# Patient Record
Sex: Female | Born: 1965 | Race: White | Hispanic: No
Health system: Southern US, Community
[De-identification: ages and names within clinical notes are randomized; demographics above are authoritative.]

---

## 2020-10-02 ENCOUNTER — Ambulatory Visit: Payer: 59 | Admitting: Family Medicine

## 2020-10-02 ENCOUNTER — Ambulatory Visit (HOSPITAL_BASED_OUTPATIENT_CLINIC_OR_DEPARTMENT_OTHER)
Admission: RE | Admit: 2020-10-02 | Discharge: 2020-10-02 | Disposition: A | Discharge status: Home / Self Care | Payer: 59 | Source: Ambulatory Visit | Attending: Family Medicine | Admitting: Family Medicine

## 2020-10-02 ENCOUNTER — Other Ambulatory Visit: Payer: Self-pay

## 2020-10-02 ENCOUNTER — Ambulatory Visit: Payer: Self-pay

## 2020-10-02 VITALS — BP 120/80 | Ht 61.0 in | Wt 145.0 lb

## 2020-10-02 DIAGNOSIS — M25532 Pain in left wrist: Secondary | ICD-10-CM

## 2020-10-02 DIAGNOSIS — M19049 Primary osteoarthritis, unspecified hand: Secondary | ICD-10-CM

## 2020-10-02 NOTE — Progress Notes (Signed)
  Erika Jenkins - 55 y.o. female MRN [id number]  Date of birth: 05/13/1966  SUBJECTIVE:  Including CC & ROS.  No chief complaint on file.   Erika Jenkins is a 55 y.o. female that is presenting with acute left thumb and wrist pain.  This is occurring over the [facility] joint.  She is active in martial arts and hits on a regular basis.  The pain has become more regular severe.  It is surrounding the base of the thumb.  She is having swelling and worsening of the pain.  Has tried wrapping her wrist.  No history of surgery.   Review of Systems See HPI   HISTORY: Past Medical, Surgical, Social, and Family History Reviewed & Updated per EMR.   Pertinent Historical Findings include:  History reviewed. No pertinent past medical history.  History reviewed. No pertinent surgical history.  History reviewed. No pertinent family history.  Social History   Socioeconomic History  . Marital status: Unknown    Spouse name: Not on file  . Number of children: Not on file  . Years of education: Not on file  . Highest education level: Not on file  Occupational History  . Not on file  Tobacco Use  . Smoking status: Not on file  . Smokeless tobacco: Not on file  Substance and Sexual Activity  . Alcohol use: Not on file  . Drug use: Not on file  . Sexual activity: Not on file  Other Topics Concern  . Not on file  Social History Narrative  . Not on file   Social Determinants of Health   Financial Resource Strain: Not on file  Food Insecurity: Not on file  Transportation Needs: Not on file  Physical Activity: Not on file  Stress: Not on file  Social Connections: Not on file  Intimate Partner Violence: Not on file     PHYSICAL EXAM:  VS: BP 120/80 (BP Location: Right Arm, Patient Position: Sitting, Cuff Size: Large)   Ht 5\' 1"  (1.549 m)   Wt 145 lb (65.8 kg)   BMI 27.40 kg/m  Physical Exam Gen: NAD, alert, cooperative with exam, well-appearing MSK:  Left hand: Swelling and  ecchymosis occurring overlying the CMC joint. Tenderness to palpation of the [facility] joint. No instability with the base of the thumb. Tenderness to palpation over the scaphoid in the snuffbox. Limited range of motion of the wrist. Neurovascular intact  Limited ultrasound: Left hand:  Degenerative changes appreciated of the [facility] joint. Degenerative changes appreciated of the trapezoid and trapezium. Has increased hyperemia surrounding the distal pole of the scaphoid.  Concern for possible fracture. No change of the distal radius.  Normal-appearing first dorsal compartment.  Summary: Degenerative changes appreciated within the wrist with concern for possible occult scaphoid fracture.  Ultrasound and interpretation by [facility], MD    ASSESSMENT & PLAN:   CMC arthritis She does have degenerative changes of the [facility] joint and other carpal joints.  Concern for possible scaphoid fracture. -Counseled on home exercise therapy and supportive care. -Thumb spica. -X-ray. -Follow-up in 2 weeks.  Could consider injection versus further imaging.

## 2020-10-02 NOTE — Patient Instructions (Signed)
Nice to meet you Please try ice on the area  Please try the splint  I will call with the results from today   Please send me a message in MyChart with any questions or updates.  Please see me back in 2 weeks.   --Dr. Jordan Likes

## 2020-10-03 ENCOUNTER — Encounter: Payer: Self-pay | Admitting: Family Medicine

## 2020-10-03 DIAGNOSIS — M19049 Primary osteoarthritis, unspecified hand: Secondary | ICD-10-CM | POA: Insufficient documentation

## 2020-10-03 NOTE — Assessment & Plan Note (Signed)
She does have degenerative changes of the [facility] joint and other carpal joints.  Concern for possible scaphoid fracture. -Counseled on home exercise therapy and supportive care. -Thumb spica. -X-ray. -Follow-up in 2 weeks.  Could consider injection versus further imaging.

## 2020-10-09 ENCOUNTER — Telehealth: Payer: Self-pay | Admitting: Family Medicine

## 2020-10-09 NOTE — Telephone Encounter (Signed)
Pt informed of below.  She will keep ROV on 10/16/20.

## 2020-10-09 NOTE — Telephone Encounter (Signed)
Left VM for patient. If she calls back please have her speak with a nurse/CMA and inform that her xray is showing severe degenerative changes at the joint at the base of the thumb.   If any questions then please take the best time and phone number to call and I will try to call her back.   Myra Rude, MD [facility] 10/09/2020, 8:15 AM

## 2020-10-16 ENCOUNTER — Ambulatory Visit: Payer: Self-pay

## 2020-10-16 ENCOUNTER — Other Ambulatory Visit: Payer: Self-pay

## 2020-10-16 ENCOUNTER — Ambulatory Visit (INDEPENDENT_AMBULATORY_CARE_PROVIDER_SITE_OTHER): Payer: 59 | Admitting: Family Medicine

## 2020-10-16 VITALS — BP 120/62 | Ht 61.0 in | Wt 140.0 lb

## 2020-10-16 DIAGNOSIS — M19049 Primary osteoarthritis, unspecified hand: Secondary | ICD-10-CM

## 2020-10-16 MED ORDER — TRIAMCINOLONE ACETONIDE 40 MG/ML IJ SUSP
40.0000 mg | Freq: Once | INTRAMUSCULAR | Status: AC
  Administered 2020-10-16: 40 mg via INTRA_ARTICULAR

## 2020-10-16 NOTE — Progress Notes (Signed)
  Erika Jenkins - 55 y.o. female MRN [id number]  Date of birth: [birth year]  SUBJECTIVE:  Including CC & ROS.  No chief complaint on file.   Erika Jenkins is a 55 y.o. female that is presenting with worsening of his left thumb pain.  Has been using the thumb spica.  Pain is intermittent in nature.  Still having some swelling.  Independent review of the left wrist x-ray from 2/16 shows severe degenerative change of the [facility] joint.   Review of Systems See HPI   HISTORY: Past Medical, Surgical, Social, and Family History Reviewed & Updated per EMR.   Pertinent Historical Findings include:  No past medical history on file.  No past surgical history on file.  No family history on file.  Social History   Socioeconomic History  . Marital status: Unknown    Spouse name: Not on file  . Number of children: Not on file  . Years of education: Not on file  . Highest education level: Not on file  Occupational History  . Not on file  Tobacco Use  . Smoking status: Not on file  . Smokeless tobacco: Not on file  Substance and Sexual Activity  . Alcohol use: Not on file  . Drug use: Not on file  . Sexual activity: Not on file  Other Topics Concern  . Not on file  Social History Narrative  . Not on file   Social Determinants of Health   Financial Resource Strain: Not on file  Food Insecurity: Not on file  Transportation Needs: Not on file  Physical Activity: Not on file  Stress: Not on file  Social Connections: Not on file  Intimate Partner Violence: Not on file     PHYSICAL EXAM:  VS: BP 120/62 (BP Location: Right Arm, Patient Position: Sitting, Cuff Size: Normal)   Ht 5\' 1"  (1.549 m)   Wt 140 lb (63.5 kg)   BMI 26.45 kg/m  Physical Exam Gen: NAD, alert, cooperative with exam, well-appearing MSK:  Left wrist: Swelling and tenderness palpation of the CMC joint. Limited range of motion. Neurovascular intact   Aspiration/Injection Procedure Note Erika Jenkins 1966-02-26  Procedure: Injection Indications: Left thumb pain  Procedure Details Consent: Risks of procedure as well as the alternatives and risks of each were explained to the (patient/caregiver).  Consent for procedure obtained. Time Out: Verified patient identification, verified procedure, site/side was marked, verified correct patient position, special equipment/implants available, medications/allergies/relevent history reviewed, required imaging and test results available.  Performed.  The area was cleaned with iodine and alcohol swabs.    The left CMC joint was injected using 1 cc's of 40 mg Kenalog and 1 cc's of 0.25% bupivacaine with a 25 1 1/2" needle.  Ultrasound was used. Images were obtained in long views showing the injection.     A sterile dressing was applied.  Patient did tolerate procedure well.     ASSESSMENT & PLAN:   CMC arthritis Pain is ongoing. Still wants to be active.  -Counseled on home exercise therapy and supportive care. -Injection. -Referral occupational therapy. -Counseled on using the thumb spica

## 2020-10-16 NOTE — Addendum Note (Signed)
Addended by: Merrilyn Puma on: 10/16/2020 11:12 AM   Modules accepted: Orders

## 2020-10-16 NOTE — Assessment & Plan Note (Signed)
Pain is ongoing. Still wants to be active.  -Counseled on home exercise therapy and supportive care. -Injection. -Referral occupational therapy. -Counseled on using the thumb spica

## 2020-10-16 NOTE — Patient Instructions (Signed)
Good to see you Please try ice  We'll see you to the therapist   Please send me a message in MyChart with any questions or updates.  Please see me back in 4-6 weeks.   --Dr. Jordan Likes

## 2020-11-20 ENCOUNTER — Ambulatory Visit: Payer: 59 | Admitting: Family Medicine

## 2020-11-20 ENCOUNTER — Other Ambulatory Visit: Payer: Self-pay

## 2020-11-20 DIAGNOSIS — M19049 Primary osteoarthritis, unspecified hand: Secondary | ICD-10-CM | POA: Diagnosis not present

## 2020-11-20 NOTE — Patient Instructions (Signed)
Good to see you Please continue taping  Please use ice as needed   Please send me a message in MyChart with any questions or updates.  Please see [place] back as needed.   --Dr. Jordan Likes

## 2020-11-20 NOTE — Assessment & Plan Note (Signed)
Has had improvement since the injection. -Counseled on home exercise therapy and supportive care. -Could consider therapy -Follow-up as needed.

## 2020-11-20 NOTE — Progress Notes (Signed)
  Erika Jenkins - 55 y.o. female MRN [id number]  Date of birth: 23-Oct-1965  SUBJECTIVE:  Including CC & ROS.  No chief complaint on file.   Erika Jenkins is a 55 y.o. female that is following up for her left thumb pain.  She has been doing well since the injection.  She has been taping her thumb as well as using the thumb spica.    Review of Systems See HPI   HISTORY: Past Medical, Surgical, Social, and Family History Reviewed & Updated per EMR.   Pertinent Historical Findings include:  No past medical history on file.  No past surgical history on file.  No family history on file.  Social History   Socioeconomic History  . Marital status: Unknown    Spouse name: Not on file  . Number of children: Not on file  . Years of education: Not on file  . Highest education level: Not on file  Occupational History  . Not on file  Tobacco Use  . Smoking status: Not on file  . Smokeless tobacco: Not on file  Substance and Sexual Activity  . Alcohol use: Not on file  . Drug use: Not on file  . Sexual activity: Not on file  Other Topics Concern  . Not on file  Social History Narrative  . Not on file   Social Determinants of Health   Financial Resource Strain: Not on file  Food Insecurity: Not on file  Transportation Needs: Not on file  Physical Activity: Not on file  Stress: Not on file  Social Connections: Not on file  Intimate Partner Violence: Not on file     PHYSICAL EXAM:  VS: BP 100/70 (BP Location: Right Arm, Patient Position: Sitting, Cuff Size: Normal)   Ht 5\' 1"  (1.549 m)   Wt 140 lb (63.5 kg)   BMI 26.45 kg/m  Physical Exam Gen: NAD, alert, cooperative with exam, well-appearing MSK:  Left thumb: No swelling or ecchymosis. Good range of motion. No tenderness to palpation. Neurovascular intact     ASSESSMENT & PLAN:   CMC arthritis Has had improvement since the injection. -Counseled on home exercise therapy and supportive care. -Could  consider therapy -Follow-up as needed.

## 2021-10-13 IMAGING — DX DG WRIST COMPLETE 3+V*L*
4 series · 4 of 4 positions shown · non-contrast
Comparison: None.

CLINICAL DATA: Left wrist pain

EXAM:
LEFT WRIST - COMPLETE 3+ VIEW

[wrist pa]
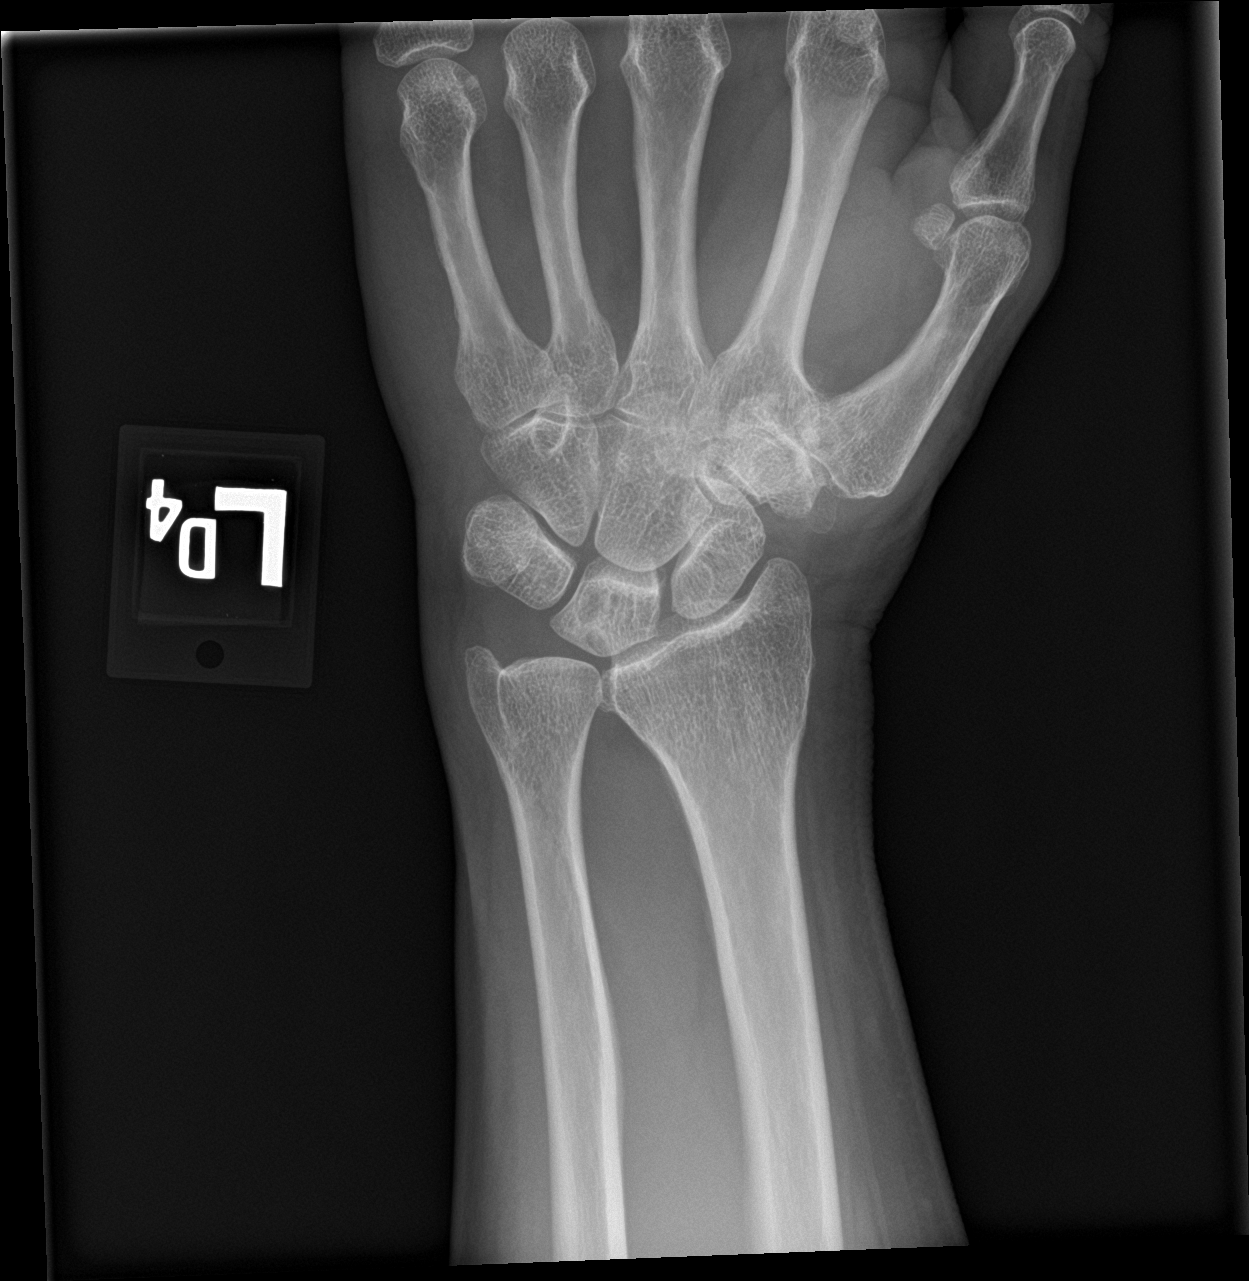

[wrist obl]
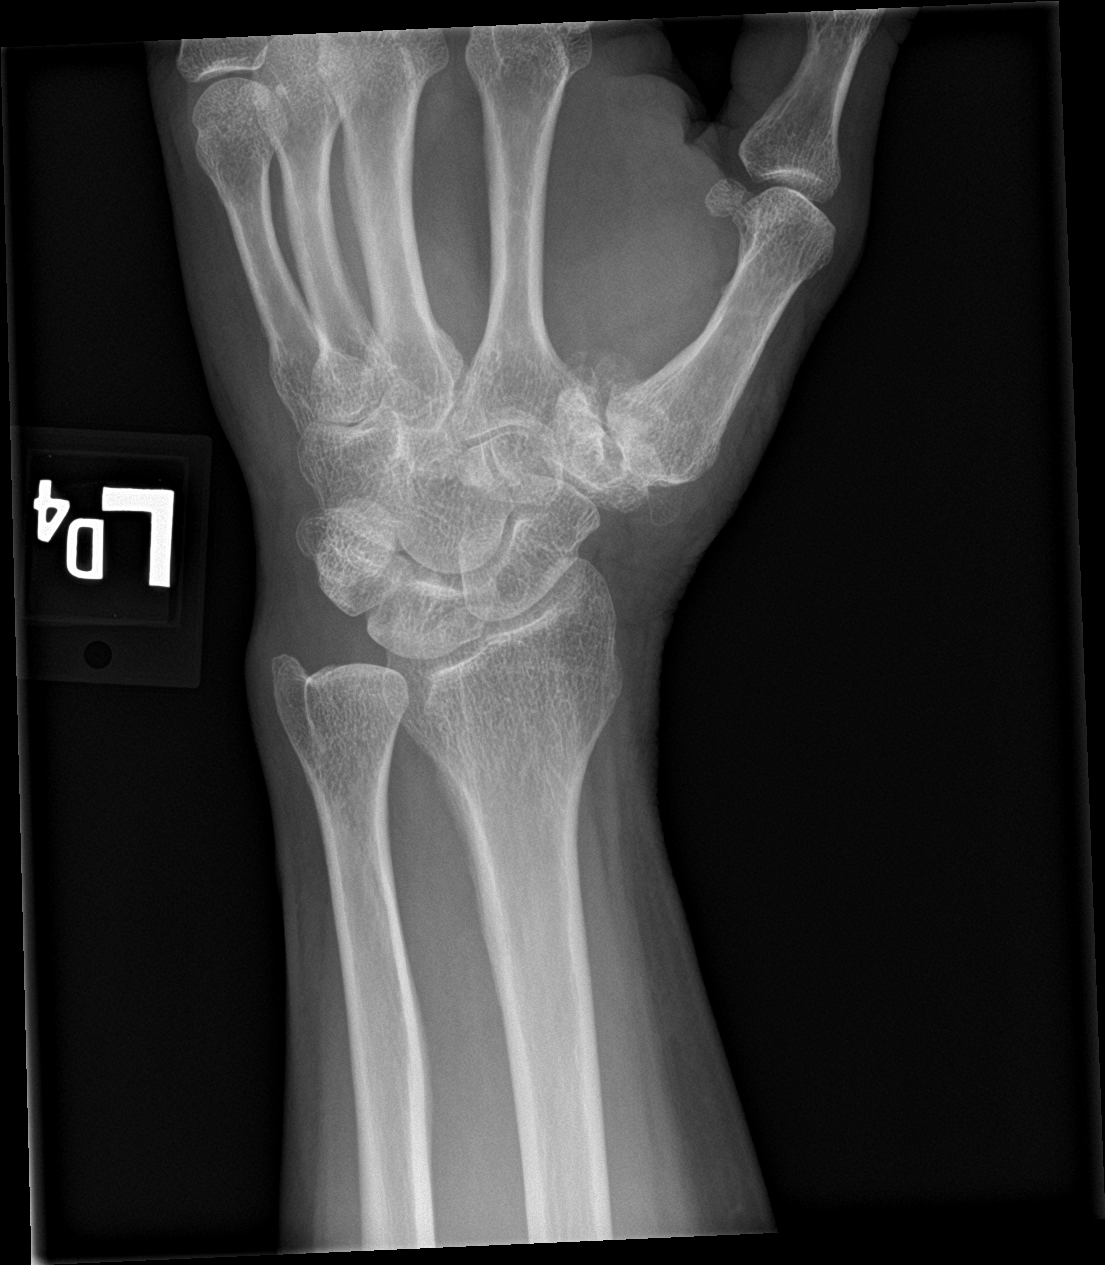

[wrist lat]
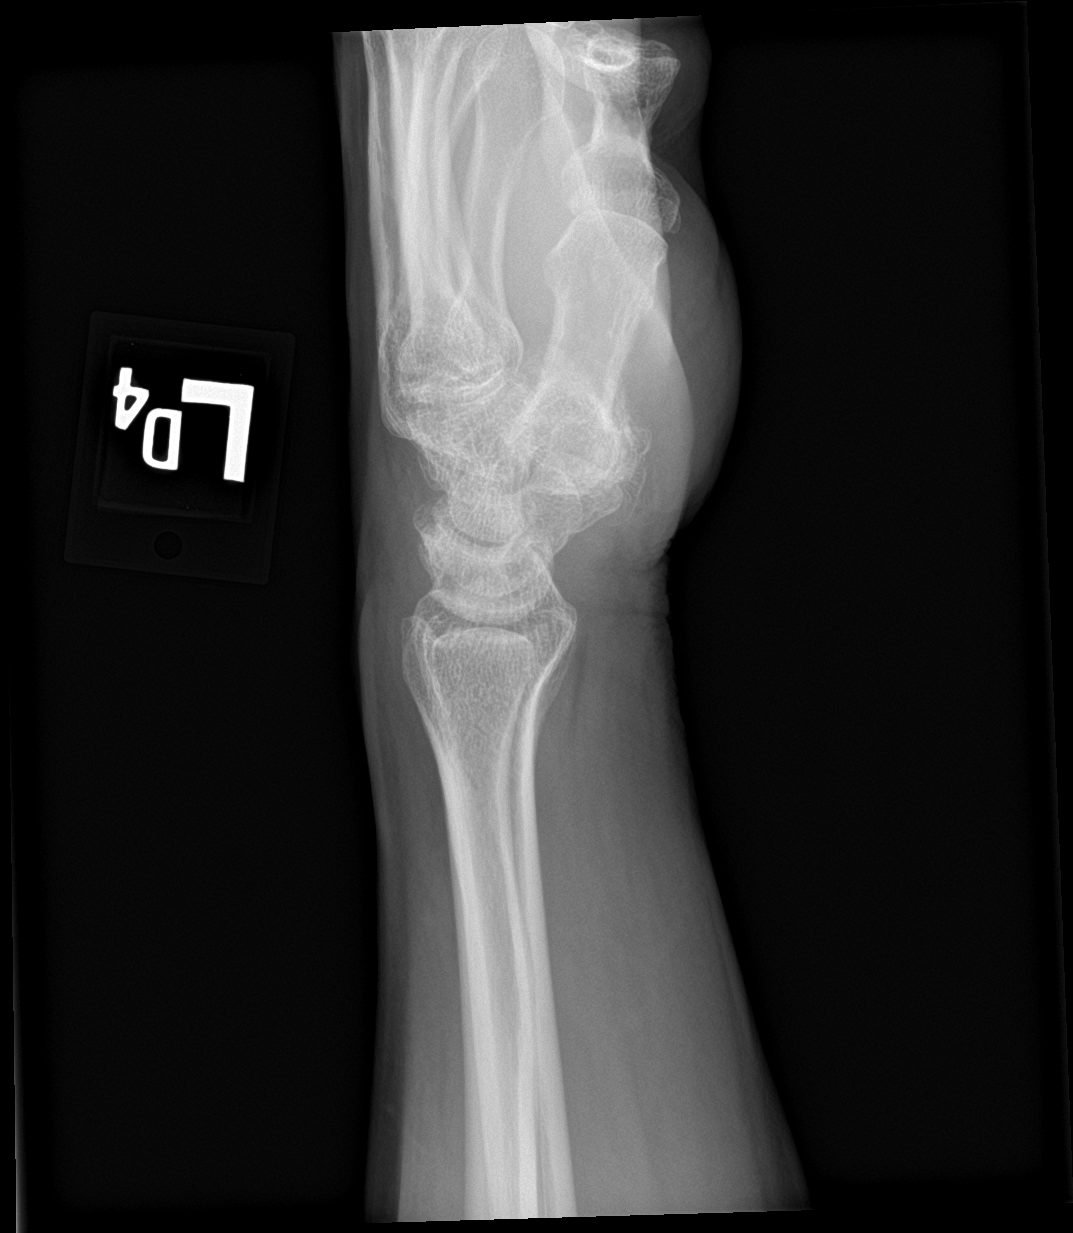

[wrist navicular]
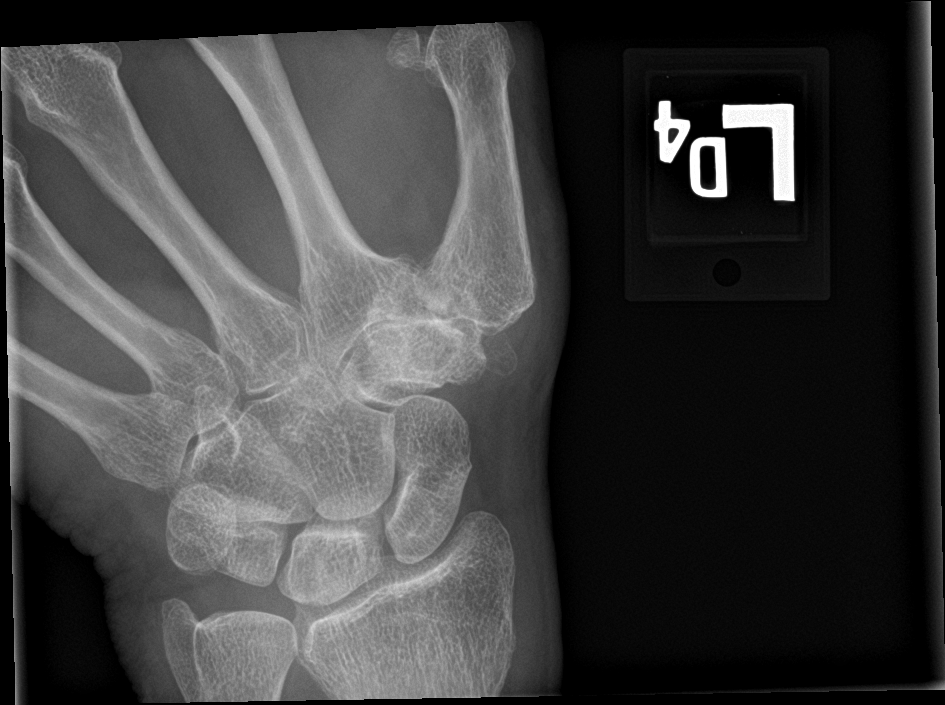

[4 of 4 positions shown; findings below may reference images not displayed]

FINDINGS: There is no acute displaced fracture. There are degenerative changes
of the radiocarpal joint. There are advanced degenerative changes of
the first CMC. There is no significant soft tissue swelling about
the wrist.
IMPRESSION: 1. No acute displaced fracture.
2. Advanced degenerative changes of the first CMC.

## 2022-12-02 ENCOUNTER — Encounter: Payer: Self-pay | Admitting: *Deleted
